# Patient Record
Sex: Male | Born: 1935 | Race: Black or African American | Hispanic: No | Marital: Single
Health system: Southern US, Community
[De-identification: ages and names within clinical notes are randomized; demographics above are authoritative.]

---

## 2012-05-19 ENCOUNTER — Encounter: Payer: Self-pay | Admitting: Rehabilitation

## 2012-06-09 ENCOUNTER — Encounter: Payer: Self-pay | Admitting: Rehabilitation

## 2013-05-23 ENCOUNTER — Inpatient Hospital Stay: Payer: Self-pay | Admitting: Internal Medicine

## 2013-05-23 LAB — BASIC METABOLIC PANEL
Anion Gap: 3 — ABNORMAL LOW (ref 7–16)
BUN: 47 mg/dL — ABNORMAL HIGH (ref 7–18)
Calcium, Total: 9.8 mg/dL (ref 8.5–10.1)
Chloride: 116 mmol/L — ABNORMAL HIGH (ref 98–107)
Co2: 30 mmol/L (ref 21–32)
EGFR (Non-African Amer.): 31 — ABNORMAL LOW
Potassium: 4.3 mmol/L (ref 3.5–5.1)

## 2013-05-23 LAB — CBC WITH DIFFERENTIAL/PLATELET
Basophil %: 0.6 %
Eosinophil #: 0.1 10*3/uL (ref 0.0–0.7)
Eosinophil %: 0.9 %
Lymphocyte #: 1.2 10*3/uL (ref 1.0–3.6)
Lymphocyte %: 16.4 %
MCV: 82 fL (ref 80–100)
Monocyte #: 0.8 x10 3/mm (ref 0.2–1.0)
Neutrophil #: 5.1 10*3/uL (ref 1.4–6.5)
RBC: 6.92 10*6/uL — ABNORMAL HIGH (ref 4.40–5.90)
RDW: 15.3 % — ABNORMAL HIGH (ref 11.5–14.5)
WBC: 7.2 10*3/uL (ref 3.8–10.6)

## 2013-05-23 LAB — URINALYSIS, COMPLETE
Blood: NEGATIVE
Glucose,UR: NEGATIVE mg/dL (ref 0–75)
Ketone: NEGATIVE
Leukocyte Esterase: NEGATIVE
Nitrite: NEGATIVE
Specific Gravity: 1.013 (ref 1.003–1.030)
Squamous Epithelial: NONE SEEN

## 2013-05-23 LAB — COMPREHENSIVE METABOLIC PANEL
Anion Gap: 5 — ABNORMAL LOW (ref 7–16)
BUN: 58 mg/dL — ABNORMAL HIGH (ref 7–18)
Co2: 28 mmol/L (ref 21–32)
Creatinine: 2.52 mg/dL — ABNORMAL HIGH (ref 0.60–1.30)
EGFR (African American): 28 — ABNORMAL LOW
Glucose: 108 mg/dL — ABNORMAL HIGH (ref 65–99)
Potassium: 4.2 mmol/L (ref 3.5–5.1)
SGOT(AST): 35 U/L (ref 15–37)
SGPT (ALT): 37 U/L (ref 12–78)
Sodium: 149 mmol/L — ABNORMAL HIGH (ref 136–145)
Total Protein: 9.2 g/dL — ABNORMAL HIGH (ref 6.4–8.2)

## 2013-05-23 LAB — APTT: Activated PTT: 148.7 secs — ABNORMAL HIGH (ref 23.6–35.9)

## 2013-05-23 LAB — CK TOTAL AND CKMB (NOT AT ARMC)
CK, Total: 136 U/L (ref 35–232)
CK, Total: 104 U/L (ref 35–232)
CK-MB: 2 ng/mL (ref 0.5–3.6)

## 2013-05-23 LAB — CBC
HCT: 57.9 % — ABNORMAL HIGH (ref 40.0–52.0)
HGB: 18 g/dL (ref 13.0–18.0)
MCHC: 31.1 g/dL — ABNORMAL LOW (ref 32.0–36.0)
MCV: 82 fL (ref 80–100)
Platelet: 166 10*3/uL (ref 150–440)
RBC: 7.03 10*6/uL — ABNORMAL HIGH (ref 4.40–5.90)
RDW: 14.9 % — ABNORMAL HIGH (ref 11.5–14.5)
WBC: 6.4 10*3/uL (ref 3.8–10.6)

## 2013-05-23 LAB — TROPONIN I
Troponin-I: 0.15 ng/mL — ABNORMAL HIGH
Troponin-I: 0.14 ng/mL — ABNORMAL HIGH
Troponin-I: 0.17 ng/mL — ABNORMAL HIGH

## 2013-05-23 LAB — AMMONIA: Ammonia, Plasma: 26 mcmol/L (ref 11–32)

## 2013-05-23 LAB — PROTIME-INR: INR: 1.2

## 2013-05-24 ENCOUNTER — Ambulatory Visit: Payer: Self-pay | Admitting: Internal Medicine

## 2013-05-24 LAB — BASIC METABOLIC PANEL
Anion Gap: 4 — ABNORMAL LOW (ref 7–16)
Anion Gap: 2 — ABNORMAL LOW (ref 7–16)
Chloride: 117 mmol/L — ABNORMAL HIGH (ref 98–107)
Co2: 28 mmol/L (ref 21–32)
Creatinine: 1.75 mg/dL — ABNORMAL HIGH (ref 0.60–1.30)
Creatinine: 1.68 mg/dL — ABNORMAL HIGH (ref 0.60–1.30)
EGFR (African American): 43 — ABNORMAL LOW
EGFR (African American): 45 — ABNORMAL LOW
EGFR (Non-African Amer.): 37 — ABNORMAL LOW
EGFR (Non-African Amer.): 39 — ABNORMAL LOW
Osmolality: 305 (ref 275–301)
Osmolality: 302 (ref 275–301)
Potassium: 4 mmol/L (ref 3.5–5.1)
Potassium: 4.3 mmol/L (ref 3.5–5.1)
Sodium: 148 mmol/L — ABNORMAL HIGH (ref 136–145)

## 2013-05-24 LAB — CBC WITH DIFFERENTIAL/PLATELET
Basophil #: 0 10*3/uL (ref 0.0–0.1)
Basophil %: 0.8 %
Eosinophil %: 1.4 %
HGB: 16.2 g/dL (ref 13.0–18.0)
MCHC: 32.2 g/dL (ref 32.0–36.0)
MCV: 82 fL (ref 80–100)
Monocyte %: 13.3 %
Neutrophil #: 3.2 10*3/uL (ref 1.4–6.5)
Neutrophil %: 59 %
Platelet: 157 10*3/uL (ref 150–440)
RBC: 6.16 10*6/uL — ABNORMAL HIGH (ref 4.40–5.90)
RDW: 15 % — ABNORMAL HIGH (ref 11.5–14.5)
WBC: 5.4 10*3/uL (ref 3.8–10.6)

## 2013-05-24 LAB — LIPID PANEL
Cholesterol: 132 mg/dL (ref 0–200)
Triglycerides: 83 mg/dL (ref 0–200)
VLDL Cholesterol, Calc: 17 mg/dL (ref 5–40)

## 2013-05-24 LAB — PHOSPHORUS: Phosphorus: 2.7 mg/dL (ref 2.5–4.9)

## 2013-05-24 LAB — MAGNESIUM: Magnesium: 2.8 mg/dL — ABNORMAL HIGH

## 2013-05-24 LAB — PROTEIN / CREATININE RATIO, URINE
Creatinine, Urine: 161.2 mg/dL — ABNORMAL HIGH (ref 30.0–125.0)
Protein, Random Urine: 77 mg/dL — ABNORMAL HIGH (ref 0–12)

## 2013-05-25 LAB — BASIC METABOLIC PANEL
BUN: 27 mg/dL — ABNORMAL HIGH (ref 7–18)
Calcium, Total: 9.6 mg/dL (ref 8.5–10.1)
Co2: 28 mmol/L (ref 21–32)
Creatinine: 1.66 mg/dL — ABNORMAL HIGH (ref 0.60–1.30)
EGFR (African American): 46 — ABNORMAL LOW
EGFR (Non-African Amer.): 40 — ABNORMAL LOW
Osmolality: 294 (ref 275–301)
Potassium: 3.9 mmol/L (ref 3.5–5.1)
Sodium: 144 mmol/L (ref 136–145)

## 2013-05-26 LAB — BASIC METABOLIC PANEL
Anion Gap: 4 — ABNORMAL LOW (ref 7–16)
BUN: 21 mg/dL — ABNORMAL HIGH (ref 7–18)
Calcium, Total: 9.3 mg/dL (ref 8.5–10.1)
Chloride: 112 mmol/L — ABNORMAL HIGH (ref 98–107)
Co2: 26 mmol/L (ref 21–32)
Creatinine: 1.5 mg/dL — ABNORMAL HIGH (ref 0.60–1.30)
EGFR (African American): 52 — ABNORMAL LOW
Glucose: 134 mg/dL — ABNORMAL HIGH (ref 65–99)
Osmolality: 288 (ref 275–301)

## 2013-05-26 LAB — KAPPA/LAMBDA FREE LIGHT CHAINS (ARMC)

## 2013-05-26 LAB — PROTEIN ELECTROPHORESIS(ARMC)

## 2013-06-03 ENCOUNTER — Inpatient Hospital Stay: Payer: Self-pay | Admitting: Internal Medicine

## 2013-06-03 LAB — CBC
HCT: 61.3 % — ABNORMAL HIGH (ref 40.0–52.0)
MCV: 84 fL (ref 80–100)
Platelet: 114 10*3/uL — ABNORMAL LOW (ref 150–440)
RDW: 15.3 % — ABNORMAL HIGH (ref 11.5–14.5)
WBC: 9.2 10*3/uL (ref 3.8–10.6)

## 2013-06-03 LAB — COMPREHENSIVE METABOLIC PANEL
Albumin: 3.8 g/dL (ref 3.4–5.0)
Bilirubin,Total: 0.7 mg/dL (ref 0.2–1.0)
EGFR (African American): 18 — ABNORMAL LOW
EGFR (Non-African Amer.): 16 — ABNORMAL LOW
Glucose: 146 mg/dL — ABNORMAL HIGH (ref 65–99)
Potassium: 5.6 mmol/L — ABNORMAL HIGH (ref 3.5–5.1)
SGOT(AST): 51 U/L — ABNORMAL HIGH (ref 15–37)
SGPT (ALT): 39 U/L (ref 12–78)
Total Protein: 8.9 g/dL — ABNORMAL HIGH (ref 6.4–8.2)

## 2013-06-03 LAB — TROPONIN I: Troponin-I: 0.28 ng/mL — ABNORMAL HIGH

## 2013-06-04 LAB — BASIC METABOLIC PANEL
BUN: 107 mg/dL — ABNORMAL HIGH (ref 7–18)
BUN: 100 mg/dL — ABNORMAL HIGH (ref 7–18)
BUN: 94 mg/dL — ABNORMAL HIGH (ref 7–18)
BUN: 86 mg/dL — ABNORMAL HIGH (ref 7–18)
Calcium, Total: 9.8 mg/dL (ref 8.5–10.1)
Calcium, Total: 9.9 mg/dL (ref 8.5–10.1)
Calcium, Total: 9.4 mg/dL (ref 8.5–10.1)
Chloride: >128 mmol/L — ABNORMAL HIGH (ref 98–107)
Chloride: 128 mmol/L — ABNORMAL HIGH (ref 98–107)
Chloride: >128 mmol/L — ABNORMAL HIGH (ref 98–107)
Chloride: 125 mmol/L — ABNORMAL HIGH (ref 98–107)
Co2: 31 mmol/L (ref 21–32)
Co2: 27 mmol/L (ref 21–32)
Co2: 25 mmol/L (ref 21–32)
Creatinine: 3.58 mg/dL — ABNORMAL HIGH (ref 0.60–1.30)
Creatinine: 2.92 mg/dL — ABNORMAL HIGH (ref 0.60–1.30)
EGFR (African American): 18 — ABNORMAL LOW
EGFR (African American): 20 — ABNORMAL LOW
EGFR (African American): 22 — ABNORMAL LOW
EGFR (African American): 23 — ABNORMAL LOW
EGFR (Non-African Amer.): 16 — ABNORMAL LOW
EGFR (Non-African Amer.): 17 — ABNORMAL LOW
Glucose: 178 mg/dL — ABNORMAL HIGH (ref 65–99)
Glucose: 178 mg/dL — ABNORMAL HIGH (ref 65–99)
Potassium: 4.7 mmol/L (ref 3.5–5.1)
Sodium: >160 mmol/L (ref 136–145)
Sodium: >160 mmol/L (ref 136–145)
Sodium: 157 mmol/L — ABNORMAL HIGH (ref 136–145)

## 2013-06-04 LAB — URINALYSIS, COMPLETE
Bacteria: NONE SEEN
Bilirubin,UR: NEGATIVE
Glucose,UR: NEGATIVE mg/dL (ref 0–75)
Hyaline Cast: 3
Ketone: NEGATIVE
Leukocyte Esterase: NEGATIVE
Ph: 5 (ref 4.5–8.0)
Protein: 30
RBC,UR: 3 /HPF (ref 0–5)
Squamous Epithelial: NONE SEEN
WBC UR: 2 /HPF (ref 0–5)

## 2013-06-04 LAB — CK-MB
CK-MB: 3 ng/mL (ref 0.5–3.6)
CK-MB: 3.6 ng/mL (ref 0.5–3.6)

## 2013-06-04 LAB — TROPONIN I
Troponin-I: 0.28 ng/mL — ABNORMAL HIGH
Troponin-I: 0.28 ng/mL — ABNORMAL HIGH

## 2013-06-04 LAB — PHOSPHORUS: Phosphorus: 5.1 mg/dL — ABNORMAL HIGH (ref 2.5–4.9)

## 2013-06-04 LAB — MAGNESIUM: Magnesium: 3.9 mg/dL — ABNORMAL HIGH

## 2013-06-05 LAB — BASIC METABOLIC PANEL
Anion Gap: 9 (ref 7–16)
Anion Gap: 9 (ref 7–16)
BUN: 75 mg/dL — ABNORMAL HIGH (ref 7–18)
Calcium, Total: 9.3 mg/dL (ref 8.5–10.1)
Chloride: 124 mmol/L — ABNORMAL HIGH (ref 98–107)
Co2: 19 mmol/L — ABNORMAL LOW (ref 21–32)
Co2: 20 mmol/L — ABNORMAL LOW (ref 21–32)
Creatinine: 2.63 mg/dL — ABNORMAL HIGH (ref 0.60–1.30)
EGFR (African American): 25 — ABNORMAL LOW
EGFR (African American): 28 — ABNORMAL LOW
EGFR (Non-African Amer.): 22 — ABNORMAL LOW
EGFR (Non-African Amer.): 24 — ABNORMAL LOW
Glucose: 206 mg/dL — ABNORMAL HIGH (ref 65–99)
Glucose: 147 mg/dL — ABNORMAL HIGH (ref 65–99)
Osmolality: 333 (ref 275–301)
Potassium: 4.3 mmol/L (ref 3.5–5.1)
Potassium: 4.3 mmol/L (ref 3.5–5.1)
Potassium: 4.3 mmol/L (ref 3.5–5.1)
Sodium: 155 mmol/L — ABNORMAL HIGH (ref 136–145)

## 2013-06-06 LAB — BASIC METABOLIC PANEL
Anion Gap: 6 — ABNORMAL LOW (ref 7–16)
BUN: 52 mg/dL — ABNORMAL HIGH (ref 7–18)
Calcium, Total: 9.2 mg/dL (ref 8.5–10.1)
Chloride: 123 mmol/L — ABNORMAL HIGH (ref 98–107)
Co2: 24 mmol/L (ref 21–32)
Creatinine: 2.36 mg/dL — ABNORMAL HIGH (ref 0.60–1.30)
Osmolality: 320 (ref 275–301)
Potassium: 3.7 mmol/L (ref 3.5–5.1)
Sodium: 153 mmol/L — ABNORMAL HIGH (ref 136–145)

## 2013-06-07 LAB — BASIC METABOLIC PANEL
Anion Gap: 6 — ABNORMAL LOW (ref 7–16)
BUN: 36 mg/dL — ABNORMAL HIGH (ref 7–18)
Calcium, Total: 9.1 mg/dL (ref 8.5–10.1)
Chloride: 119 mmol/L — ABNORMAL HIGH (ref 98–107)
Creatinine: 2.09 mg/dL — ABNORMAL HIGH (ref 0.60–1.30)
EGFR (Non-African Amer.): 30 — ABNORMAL LOW
Sodium: 152 mmol/L — ABNORMAL HIGH (ref 136–145)

## 2013-06-09 ENCOUNTER — Ambulatory Visit: Payer: Self-pay | Admitting: Internal Medicine

## 2013-07-10 DEATH — deceased

## 2014-08-28 IMAGING — CT CT HEAD WITHOUT CONTRAST
2 of 3 series · 16 of 30 positions shown, 19 images · non-contrast
Comparison: None.

CLINICAL DATA: Periods of apnea.  Progressive confusion.

EXAM:
CT HEAD WITHOUT CONTRAST
TECHNIQUE: Contiguous axial images were obtained from the base of the skull
through the vertex without intravenous contrast.

[Series 2: head wo · axial · 0.46mm/px · z∈[+370,+490]mm · 9 of 31 slices shown, 12 images]
[im 4/31  brain]
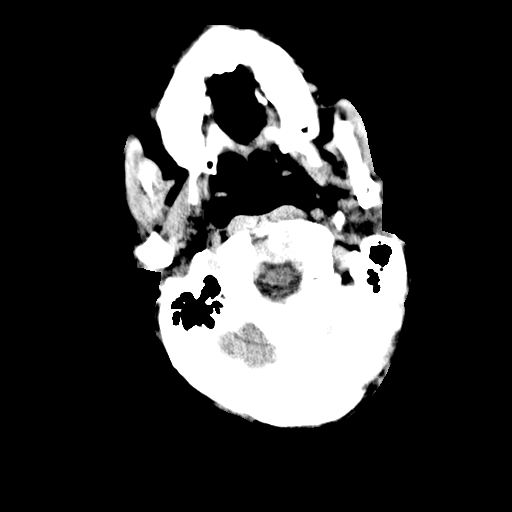
[im 4/31  bone]
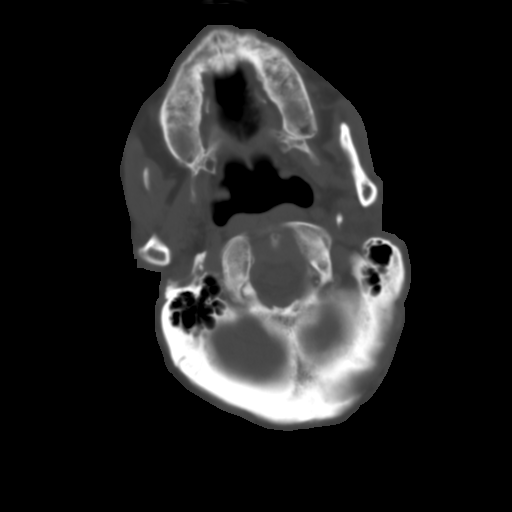
[im 7/31  brain]
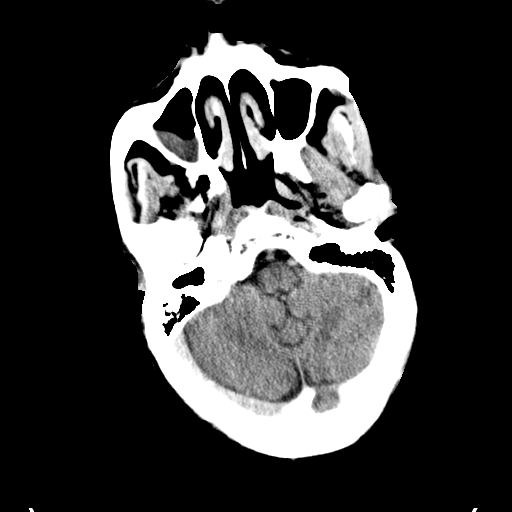
[im 10/31  brain]
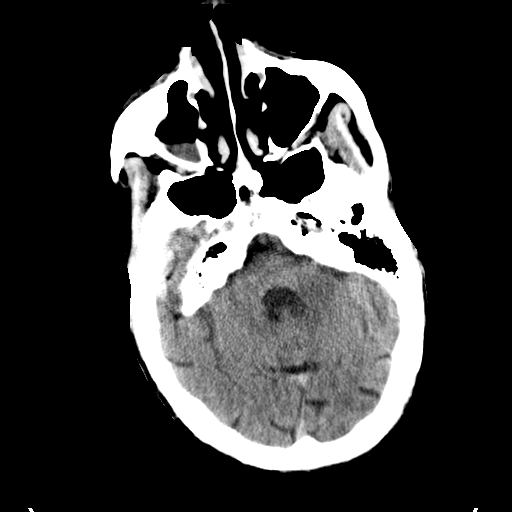
[im 13/31  brain]
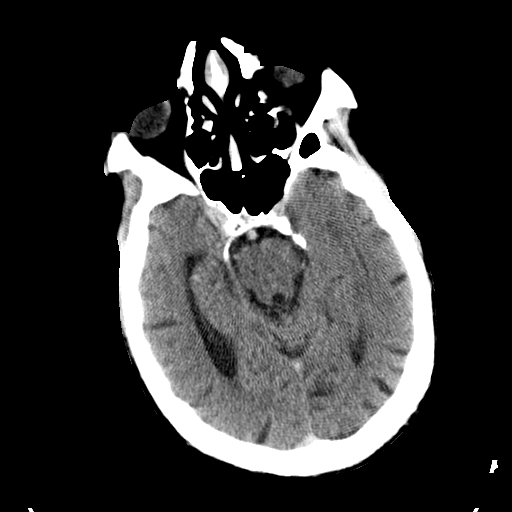
[im 16/31  brain]
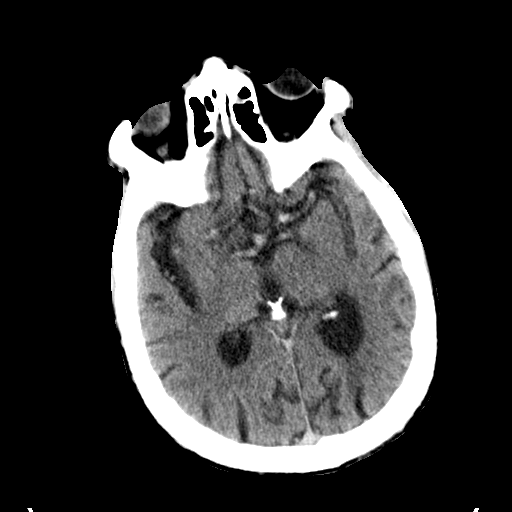
[im 16/31  bone]
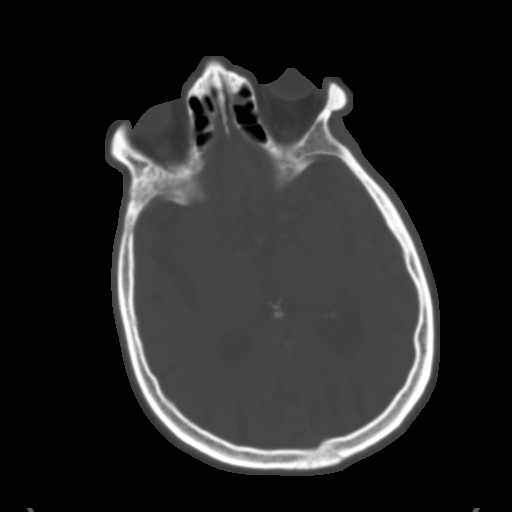
[im 19/31  brain]
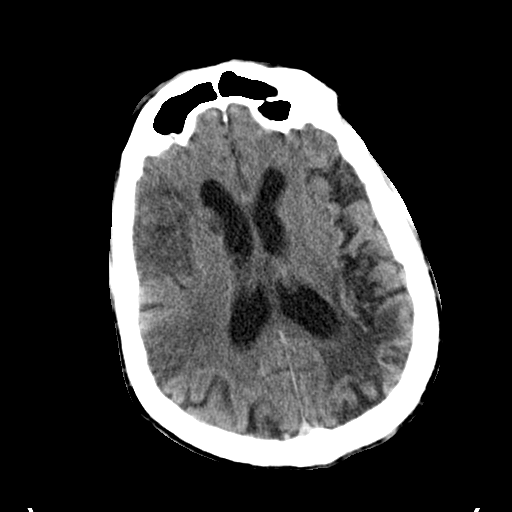
[im 22/31  brain]
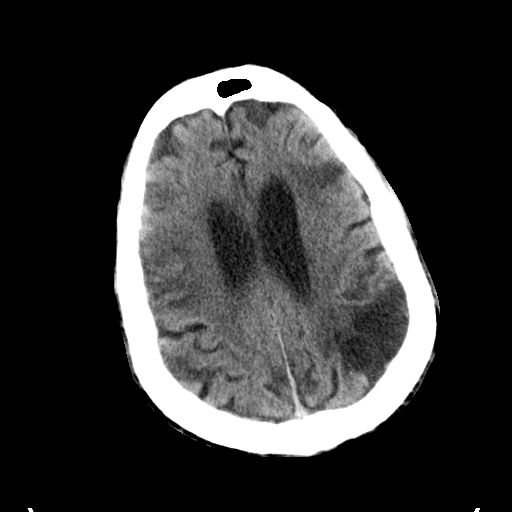
[im 25/31  brain]
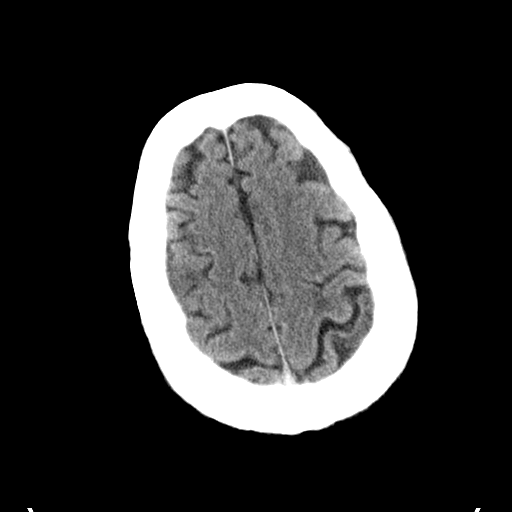
[im 28/31  brain]
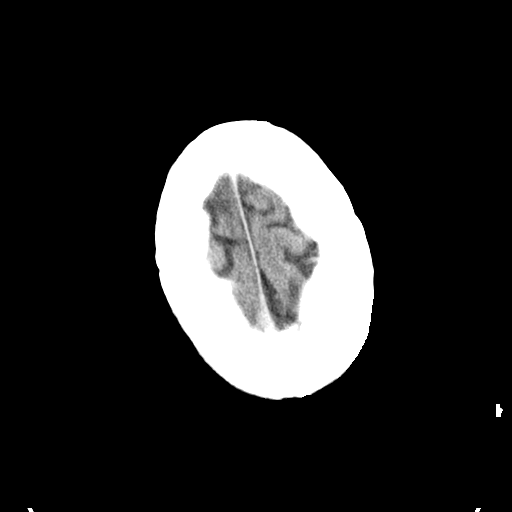
[im 28/31  bone]
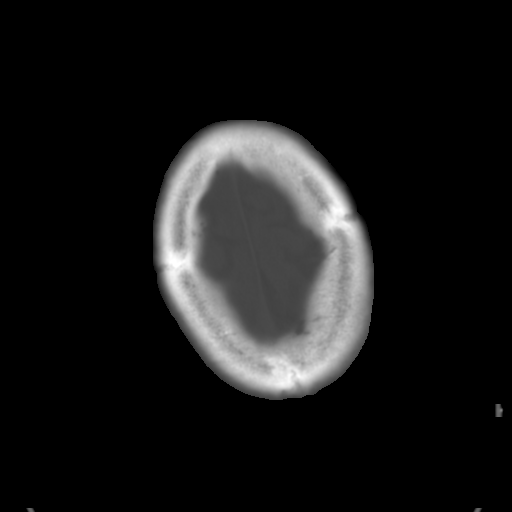

[Series 4: head wo recon · axial · 0.46mm/px · z∈[+449,+527]mm · 7 of 26 slices shown]
[im 4/26  brain]
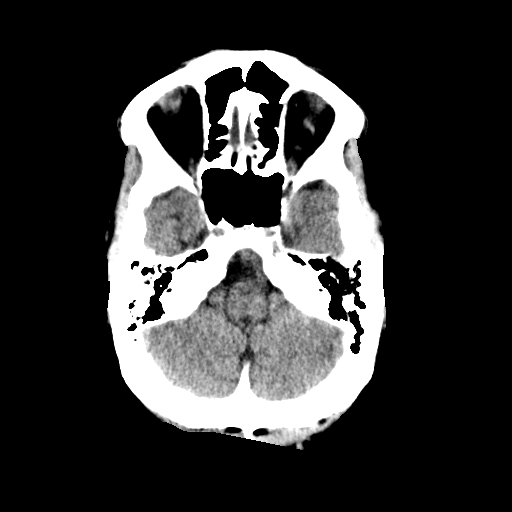
[im 7/26  brain]
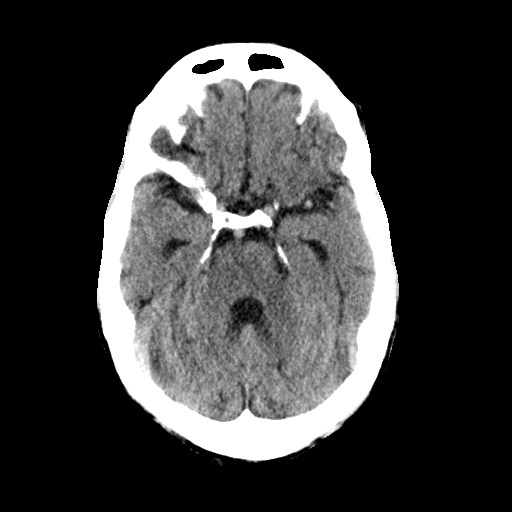
[im 10/26  brain]
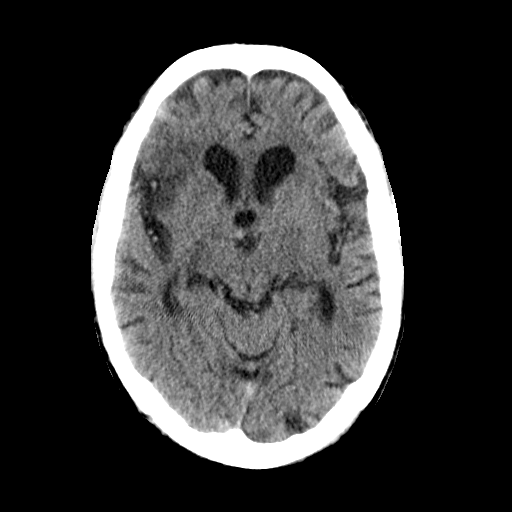
[im 13/26  brain]
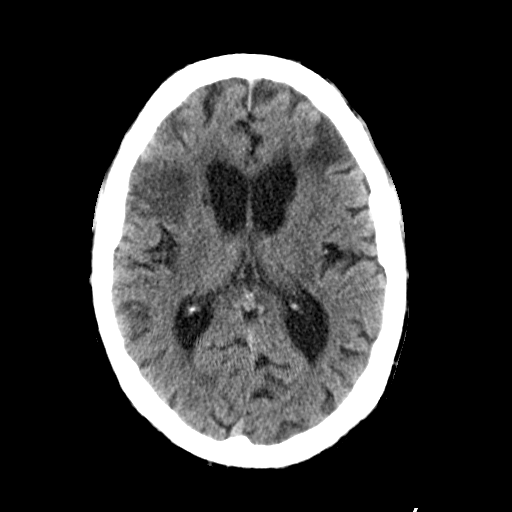
[im 16/26  brain]
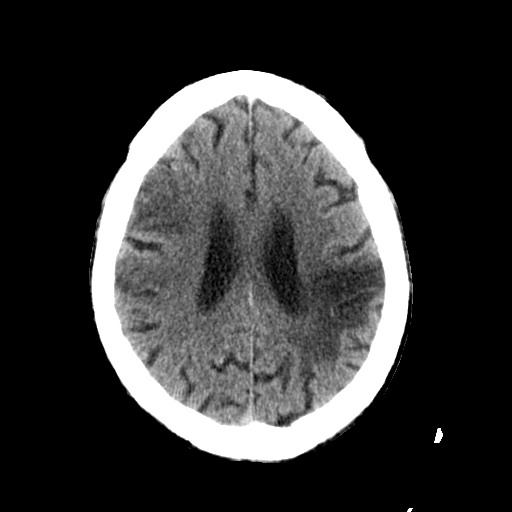
[im 19/26  brain]
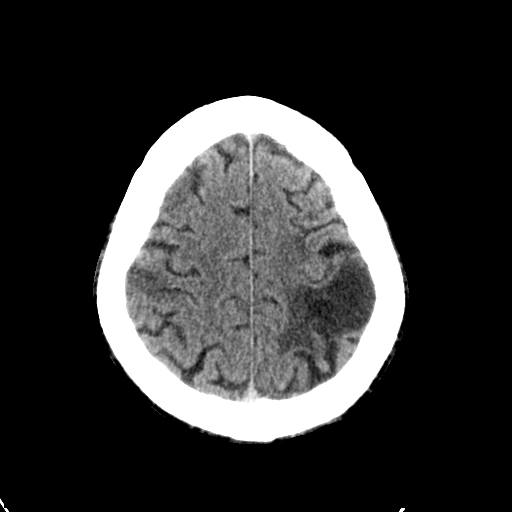
[im 22/26  brain]
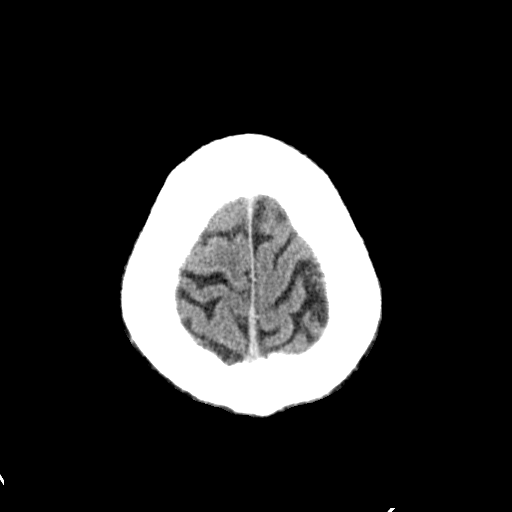

[16 of 30 positions shown; findings below may reference images not displayed]

FINDINGS: There are progressive changes of large right MCA infarct. Compared
with the previous examination there is been increase in low
attenuation involving the right frontal and parietal lobes with
further effacement of the sulci and loss of gray white matter
differentiation. Stable appearance of encephalomalacia involving the
left parietal and frontal lobes. The stress set there is prominence
of the sulci and ventricles consistent with atrophy. No evidence for
acute intracranial hemorrhage or mass. No midline shift identified.
The paranasal sinuses appear clear. The mastoid air cells are clear.
The skull is intact.
IMPRESSION: 1. Progressive changes of right MCA territory infarct.
2. Stable encephalomalacia involving the left frontal lobe and left
parietal lobe.

## 2014-09-29 NOTE — Consult Note (Signed)
Brief Consult Note: Diagnosis: alteerred mental status. not able to give history.   Patient was seen by consultant.   Recommend further assessment or treatment.   Comments: 79 yo male with apparent history of diastollic chf in the past. Admitted with alterred mental status. Minimzl troponin elevationi likely secndary to demand ischemia and renal insuffiency. Echo pending. CXR does not suggest florid edema. ill follow and review echo when available. Fuill note to follow.  Electronic Signatures: Dalia HeadingFath, Lielle Vandervort A (MD)  (Signed 15-Dec-14 17:01)  Authored: Brief Consult Note   Last Updated: 15-Dec-14 17:01 by Dalia HeadingFath, Devyn Sheerin A (MD)

## 2014-09-29 NOTE — H&P (Signed)
PATIENT NAME:  Jacob Petty, Jacob Petty MR#:  [id number] DATE OF BIRTH:  Sep 17, 1935  DATE OF ADMISSION:  05/23/2013  PRIMARY CARE PHYSICIAN: Nonlocal.    REFERRING PHYSICIAN: Dr. Suella Broad.   CHIEF COMPLAINT: Altered mental status.   HISTORY OF PRESENT ILLNESS: The patient is a 79 year old African American male residing at [facility] with a past medical history of stroke with bilateral hemiparesis, hypertension, chronic systolic and diastolic congestive heart failure, hypercholesterolemia, he has been brought into the ER via EMS from [facility] for altered mental status. The patient was found to be more confused and lethargic at [facility] and the nurse has called EMS to transfer the patient for further evaluation. In the ER the patient initially talked in sentences to the ER nurse, but during my examination he is totally lethargic and  opening his eyes to his name, but falling asleep. No family members are available at bedside and I was unable to reach them. The patient's initial CAT scan of the head has revealed moderate global parenchymal brain volume loss and remote left middle cerebral artery territory infarct. The patient's troponin is elevated at 0.15. A 12-lead EKG has revealed sinus rhythm at 77 beats per minute with left axis deviation. No acute ST-T wave changes. Hospitalist team was called to admit the patient. The patient is started on heparin drip as troponin is positive.   PAST MEDICAL HISTORY: From [facility] medical records, history of stroke with hemiparesis, hypertension, hyperlipidemia, chronic systolic and diastolic congestive heart failure and probably renal insufficiency.   PAST SURGICAL HISTORY: Unavailable.   ALLERGIES: No known drug allergies.   PSYCHOSOCIAL HISTORY: He resides at [facility]. History of smoking, alcohol is not available.   FAMILY HISTORY: Unavailable.  REVIEW OF SYSTEMS: Unobtainable as the patient is lethargic.   HOME  MEDICATIONS: Senna 8.6 mg 2 tablets p.o. once daily, lisinopril 10 mg once a day, Colace 100 mg 3 times a day as needed,  aspirin 81 mg once daily.   PHYSICAL EXAMINATION:  VITAL SIGNS: Temperature 97 degrees Fahrenheit, pulse 68, respirations 14 to 18, blood pressure 110/71, pulse oximetry 94%.  GENERAL APPEARANCE: Not in respiratory distress. The patient is very lethargic but arousable to his name and touch sensation and falling asleep. No family members are at bedside.  HEENT: Normocephalic, atraumatic. Pupils are equally reacting to light and accommodation. They are sluggishly reacting 2 to 3 mm in size. No scleral icterus. No sinus tenderness. Dry mucous membranes.  NECK: Supple. No JVD. No thyromegaly. Range of motion is intact.  LUNGS: Clear to auscultation bilaterally. No accessory muscle usage. No anterior chest tenderness on palpation.  CARDIAC: S1, S2 normal. Regular rate and rhythm. No murmurs.  GASTROINTESTINAL: Soft. Bowel sounds are positive in all four quadrants. No masses felt. No hepatosplenomegaly.  NEUROLOGIC: Arousable to verbal commands, but not answering any questions, very lethargic. Could not follow verbal commands, could not elicit motor and sensory. Reflexes are 2+.  EXTREMITIES: No edema. No cyanosis. No clubbing.  SKIN: Warm to touch. Dry in nature.  MUSCULOSKELETAL: No joint effusion, tenderness.  LABORATORY AND IMAGING STUDIES: WBC 6.4, hemoglobin 18.0, hematocrit 57.9, platelets 166. Urinalysis yellow in color, clear in appearance. Nitrite and leukesterase  are negative  . LFTs: Total protein 9.2, alkaline phosphatase 148, troponin 0.15, AST and ALT are normal. BMP: BUN is at 58, creatinine 2.52, we do not know his baseline. Sodium 149, potassium 4.2, chloride 116, CO2 28, GFR is 28. Anion gap 5, calcium 10.5,  serum osmolality 313. Glucose is 108. CAT scan of the head without contrast has revealed remote left middle cerebral artery territory infarct. Mild blurring,  which may reflect motion artifact. A remote right basal ganglia and lacunar infarcts, moderate global parenchymal brain volume loss, appearance of the intracranial vessels suggest chronic hypertension. Chest x-ray, portable; mild cardiomegaly. No acute pulmonary process. A 12-lead EKG: Normal sinus rhythm at 77 beats per minute with PVCs. Normal PR interval. No acute ST-T wave changes. Inferior infarct Q waves.  ASSESSMENT AND PLAN: A 79 year old African American male brought into the Emergency Room with altered mental status from [facility] will be admitted with following assessment and plan.  1. Altered mental status. Rule out acute myocardial infarction, rule out stroke. The patient will be admitted to Critical Care Unit. We will cycle cardiac biomarkers. The patient is placed on heparin bolus and drip. Cardiology consult is placed to  Dr. Darrold JunkerParaschos, who is on call. We will order MRA of the brain. We will get a neurology consult as well.  2. Acute kidney injury. Baseline is unknown. We will provide him IV fluids while he is n.p.o.  and monitor renal function closely. Avoid nephrotoxins.  3. Remote history of stroke. The patient will be on baby aspirin and he is on heparin drip.  4. History of congestive heart failure both systolic and diastolic. The patient is not fluid overloaded. The patient being n.p.o. and lethargic, we will provide him with gently hydration with IV fluids and monitor for symptoms and signs of fluid overload.  5. Hypertension. Blood pressure is stable at this time. We will hold off on the blood pressure medication.  6. Hyperlipidemia.  7. I could not reach any family members. They are not available bedside, phone numbers are not available. The patient is FULL CODE until CODE STATUS is determined. We will give him gastrointestinal and deep vein thrombosis prophylaxis.   TOTAL TIME SPENT ON ADMISSION: Is 50 minutes.   Condition is critical.     ____________________________ Ramonita LabAruna Anber Mckiver, MD ag:sg D: 05/23/2013 07:01:49 ET T: 05/23/2013 07:31:59 ET JOB#: [id number]  cc: Ramonita LabAruna Aiden Rao, MD, <Dictator> Ramonita LabARUNA Dima Ferrufino MD ELECTRONICALLY SIGNED 06/05/2013 0:34

## 2014-09-29 NOTE — Discharge Summary (Signed)
PATIENT NAME:  Jacob Petty, Jacob Petty MR#:  [id number] DATE OF BIRTH:  [birth year]  DATE OF ADMISSION:  05/23/2013 DATE OF DISCHARGE:  05/26/2013   ADMISSION DIAGNOSIS:  Altered mental status.  DISCHARGE DIAGNOSES:   1.  Altered mental status secondary to progression of previous stroke.  2.  Cardiomyopathy, ejection fraction of 20% to 25%.  3.  Acute on chronic renal failure from acute tubular necrosis.  4.  History of chronic diastolic and systolic heart failure.  5.  Hypertension.  6.  Hypercalcemia from dehydration.  7.  Elevated troponin.  8.  Dysphasia from cerebrovascular accident.   CONSULTATIONS:  1.  Dr. Mosetta PigeonHarmeet Singh.  2.  Dr. Harvie JuniorPhifer.  3.  Dr. Theora MasterZachary Potter from neurology.  4.  Dr. Lady GaryFath from cardiology.  A 2-D echocardiogram showed an EF of 20% to 25% with severely decreased global left ventricular systolic function, mild to moderate increased left ventricular internal cavity size.   CT of the head, 05/25/2013, progression of acute infarct in the right insula and right frontal lobe without hemorrhage. EEG showed no seizure-like activity.   Discharge sodium 142, potassium 3.7, chloride 112, bicarb 26, BUN 21, creatinine 1.50. Glucose is 134. LDL 56, VLDL 17, HDL 59, triglycerides 83, cholesterol 132. Troponin max 0.17.   HOSPITAL COURSE: This is a 79 year old male from [facility] with a history of stroke and bilateral hemiparesis. He was brought to the ER for altered mental status. For further details, please refer to the H and P.  1.  Altered mental status secondary to an acute cerebrovascular accident. The patient had a CT scan repeated on the 17th, which showed progression of acute infarct in the right insula and right frontal lobe without hemorrhage. He cannot obtain an MRI, so the CT was done. He continues to have aphasia, left-sided weakness. He was seen by rehab, and he will go back to the rehabilitation center with PT, OT and speech. He is currently on dysphagia 1  diet.  2.  Acute on chronic renal failure from acute tubular necrosis. Appreciate nephrology consult. His creatinine has improved.  3.  History of chronic diastolic heart failure. He also has systolic heart failure. He was euvolemic. No obvious exacerbation at this time.  4.  Hypertension. The patient is now on low-dose Coreg due to nonsustained Vtach, which was seen here while the patient was hospitalized.  5.  Hypercalcemia secondary to dehydration, improved with intravenous fluids.  6.  Elevated troponin: The patient had an echocardiogram, showed an EF of 20% to 25%. Cardiology was consulted.  The troponin is secondary to his acute cerebral ischemic event and not due to acute coronary syndrome.  7.  Dysphagia from his cerebrovascular accident. Speech has recommended a dysphagia 1 diet with precautions for aspiration.  8.  Nonsustained ventricular tach. The patient had some evidence of nonsustained Vtach. He had an EF of 20% to 25% by echocardiogram. He also had some bradycardia. He was initially on amiodarone, but Dr. Lady GaryFath did not feel the patient needed amiodarone, due to the persistent bradycardia with intermittent junctional rhythm. He recommended discontinuing amiodarone. He was started on a very low-dose beta blocker, as heart rate did tolerate this very low-dose beta blocker,   DISCHARGE MEDICATIONS: 1.  Atorvastatin 80 mg at bedtime.  2.  Senna 8.6, 2 tablets daily.  3.  Aspirin 325 mg daily.  4.  Coreg 3.125 daily.   DISCHARGE DIET: Low-fat, low-cholesterol Magic Cup twice a day, pureed honey-thick liquids. Start strict aspiration  precautions, liquids by teaspoon feeding assistance at all meals, meds crushed in puree. Monitor for swallowing and oral cleaning when feeding. The patient required verbal cues at times to swallow foods and liquids. Moisten all foods.   REFERRALS:  PT, OT, speech.   DISCHARGE FOLLOWUP:  The  patient will follow up with Dr. Lady Gary in 1 to 2 weeks. Due to his  cardiomyopathy, he will need a repeat echocardiogram, and perhaps may need an AICD in the future.   The patient is medically stable for discharge.   TIME SPENT: Approximately 40 minutes.   ____________________________ Janyth Contes. Juliene Pina, MD spm:dmm D: 05/26/2013 12:40:55 ET T: 05/26/2013 12:58:34 ET JOB#: [id number]  cc: Makyle Eslick P. Juliene Pina, MD, <Dictator> Darlin Priestly. Lady Gary, MD Janyth Contes Orest Dygert MD ELECTRONICALLY SIGNED 05/26/2013 15:52

## 2014-09-29 NOTE — Consult Note (Signed)
   Comments   I met with pt's daughter and 2 granddaughters. Updated them on pt's current medical condition. They understand that pt was likely not taking in adequate po and became dehydrated. We discussed PEG for feeding. Family does not want to pursue PEG. They would like for pt to return to [facility] at discharge. I suggested that palliative care NP follow pt at [facility] as well. If pt declines again, then family would opt for care at the [facility].  discussed code status with family. They do not feel that pt understood the discussion I previously had with him when he indicated that he wanted to be a full code. They want pt to be DNR. Order entered. Out-of-facility DNR completed and placed in chart.   Electronic Signatures: Tennile Styles, Izora Gala (MD)  (Signed 29-Dec-14 13:00)  Authored: Palliative Care   Last Updated: 29-Dec-14 13:00 by Kailani Brass, Izora Gala (MD)

## 2014-09-29 NOTE — Consult Note (Signed)
   Comments   I met with pt in the presence of his daughter, granddaughter, aunt and ex-wife. Pt is awake, nods head appropriately to questions. Family is hopeful that pt can return to [facility] to continue STR. They understand that he may not return to independent living in his own home again and may need a long-term bed in SNF. We discussed PEG in the event pt's po intake is not adequate but pt and family do not want this.  discussed code status at length. After explaining the difference in full code and DNR, pt repeatedly states that he wants to remain a full code. Family respects pt's wishes.   Electronic Signatures: Calah Gershman, Izora Gala (MD)  (Signed 16-Dec-14 19:54)  Authored: Palliative Care   Last Updated: 16-Dec-14 19:54 by Alanny Rivers, Izora Gala (MD)
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
## 2014-09-30 NOTE — Discharge Summary (Signed)
PATIENT NAME:  Jacob Petty, Jacob Petty MR#:  [id number] DATE OF BIRTH:  [birth year]  DATE OF ADMISSION:  06/03/2013 DATE OF DISCHARGE:  06/08/2013  PRIMARY CARE PHYSICIAN:  Alden ServerErnest B. Maryellen PileEason, MD  FINAL DIAGNOSES: 1.  Acute encephalopathy.  2.  Severe hypernatremia.  3.  Acute renal failure.  4.  Failure to thrive.  5.  History of cerebrovascular accident with aphasia and dysphasia, left-sided weakness.  6.  History of hypertension.  7.  Hyperlipidemia.   DISPOSITION:  The patient discharged to the hospice home with Foley as needed. The patient is a DO NOT RESUSCITATE.   DISCHARGE MEDICATIONS:  Aspirin 325 mg daily, morphine 20 mg/5 mL, 5 mL every 2 hours as needed for pain or shortness of breath.   DIET: As tolerated.   ACTIVITY: As tolerated.   HISTORY OF PRESENT ILLNESS:  The patient was brought in with altered mental status, found with acute renal failure, severe hypernatremia and hyperkalemia and altered mental status.   LABORATORY, DIAGNOSTIC AND RADIOLOGICAL DATA DURING THE HOSPITAL COURSE: Included: A magnesium of 3.9, phosphorus 2.1. EKG normal sinus rhythm, supraventricular complexes, left axis deviation, LVH. Glucose 146, BUN 107, creatinine 3.6, sodium greater than 160, potassium 5.6, chloride 128, CO2 27, calcium 9.7, alkaline phosphatase 158, ALT 39, AST 51, total protein 8.9. White blood cell count 9.2, H and H 19.4 and 61.3, platelet count 114. Troponin borderline at 0.28. CT scan of the head showed progressive changes of right MCA territory infarct, stable encephalomalacia involving the left frontal lobe and left parietal lobe. Chest x-ray n acute cardiopulmonary disease. Next troponin 0.28. Next sodium greater than 160, creatinine 3.58. X-ray showed nasogastric tube in the fundus of the stomach. Next few sodiums still high. Last troponin borderline at 0.28. Urinalysis 2+ blood. On the 27th, creatinine came down to 2.92, sodium 157. On the 28th, creatinine came down to 2.5, sodium  153. On the 30th, creatinine 2.09, sodium 152.   HOSPITAL COURSE PER PROBLEM LIST:  1.  For the patient's acute encephalopathy, this had improved. I think this was all secondary to acute renal failure, severe dehydration and severe hypernatremia. This improved with IV fluid hydration.  2.  For the patient's acute renal failure, severe hypernatremia, severe dehydration. The patient was given D5W the entire hospital course. Creatinine improved and sodium did improve.  3.  History of CVA with aphasia, dysphasia and left-sided weakness. The patient did not do well with a speech eval, was put on a thickened liquid diet but kept on pushing the food away.  4.  Hypertension. Blood pressure was on the lower side during the hospital stay. No need for meds.  5.  Hyperlipidemia. Held patient's statin during the entire hospital course.  6.  Failure to thrive. The patient did not do well with speech therapy and his diet. He is high risk for repeat acute renal failure, severe hypernatremia and severe dehydration. This is a failure to thrive. Family was approached about the hospice home since they did not want to do a PEG placement. Overall prognosis is poor. The patient will not be able to keep up with his nutritional needs. The patient is a DO NOT RESUSCITATE and transferred to the hospice home on 06/08/2013.   TIME SPENT ON DISCHARGE: 35 minutes.   ____________________________ Herschell Dimesichard J. Renae GlossWieting, MD rjw:cs D: 06/08/2013 15:07:12 ET T: 06/08/2013 15:43:22 ET JOB#: [id number]  cc: Herschell Dimesichard J. Renae GlossWieting, MD, <Dictator> Serita ShellerErnest B. Maryellen PileEason, MD Hospice Home  Salley ScarletICHARD J Sahith Nurse MD ELECTRONICALLY SIGNED  06/09/2013 16:31 

## 2014-09-30 NOTE — H&P (Signed)
PATIENT NAME:  Jacob Petty MR#:  [id number] DATE OF BIRTH:  [birth year]  DATE OF ADMISSION:  06/03/2013  PRIMARY CARE PHYSICIAN: Dr. Maryellen PileEason.    REFERRING PHYSICIAN: Dr. Carollee MassedKaminski.   HISTORY OF PRESENT ILLNESS:  Jacob Petty is a 79 year old African-American male with history of CVA bilateral, who is a resident of [facility], is brought to the Emergency Department for altered mental status. Could not obtain any history from the patient.  No records have been sent from the nursing home about the patient's condition in the last few days. However, per the ER staff, the patient was found to have periods of apnea with oxygen saturations in the 60s, more confused than his baseline. EMS reported no periods of apnea and was satting in 90s. In the Emergency Department, the patient was found to have a sodium of 160, elevated BUN and creatinine of 107 and 3.60. The patient, at baseline, has BUN and creatinine of 21 and 1.50. This is a significant worsening from the baseline. The patient is also found to be hyperkalemic with a potassium of 5.6.  Unable to obtain any history from the patient. No obvious signs of infection are found. Chest x-ray did not show any obvious infiltrate. CT head without contrast showed worsening of the infarction on the right side.   PAST MEDICAL HISTORY:  1.  Multiple strokes.  2.  Congestive heart failure with EF of 22 to 25% per previous records.  3.  Hypertension.  4.  Hyperlipidemia.  5.  Coronary artery disease.  6.  Aphasia from the previous strokes.  7.  Chronic renal insufficiency.   PAST SURGICAL HISTORY: Unable to obtain from the patient.   SOCIAL HISTORY: [facility] lives [facility].   ALLERGIES: No known drug allergies.   HOME MEDICATIONS: 1.  Senna 8.6 mg 2 tablets once a day.  2.  Coreg 3.125 mg 2 times a day.  3.  Atorvastatin 80 mg once a day.  4.  Aspirin 325 mg once a day.   SOCIAL HISTORY: Could not be obtained from the  patient.   FAMILY HISTORY: Could not be obtained from the patient secondary to altered mental status.   REVIEW OF SYSTEMS: Could not be obtained from the patient secondary to altered mental status.   PHYSICAL EXAMINATION: GENERAL: This is a thin, chronically ill-looking male, lying down in the bed, not in distress.  VITAL SIGNS: Temperature 97.5, pulse 70, blood pressure 108/83, respiratory rate of 16, oxygen saturation is 98% on room air.  HEENT: Head normocephalic, atraumatic. There is no scleral icterus. Conjunctivae normal. Pupils are equal and react to light. Bitemporal wasting, shrunken eyes.  Mucous membranes dry.  NECK: Supple. No lymphadenopathy. No JVD. No carotid bruit. No thyromegaly.  CHEST: Has no focal tenderness.  LUNGS: Bilaterally clear to auscultation.  HEART: S1, S2, regular. No murmurs are heard. No pedal edema. Pulses 2+.  ABDOMEN: Bowel sounds present. Soft, nontender, nondistended. No hepatosplenomegaly.  SKIN: No rash or lesions.  MUSCULOSKELETAL: Could not examine as the patient is in altered mental status.  NEUROLOGIC: The patient is not oriented to place, person and time. Could not examine the cranial nerves or motor function, however, moving all four extremities. I could not examine the sensory.   LABORATORY DATA: CMP: Sodium 160, BUN 170, creatinine of 3.60, potassium of 5.6, chloride 128, bicarbonate 27.   CBC: WBC of 5.4, hemoglobin 19.4, platelet count of 114. Coag profile is completely within normal limits.  CT HEAD WITHOUT CONTRAST: Progress of changes of the right MCA territory infarct is stable encephalomalacia involving the left frontal and left parietal lobe.   CHEST X-RAY, ONE VIEW PORTABLE:  No acute cardiopulmonary disease.   ASSESSMENT AND PLAN: [name] is a 79 year old male who is from [facility] who comes to the Emergency Department with altered mental status, most likely secondary to severe dehydration.  1.  Hyponatremia. The  patient has sodium of 160, most likely this is secondary to lack of p.o. intake. Keep NG tube. Continue the free water boluses. Follow up with the BMP q.6 hours. 2.  Acute renal failure. The patient has significant elevation of the BUN and creatinine, ratio greater than 20. This is secondary to prerenal. Continue with IV fluids and follow up.  3.   Hyperkalemia, again this could be secondary to dehydration. We will give him intravenous fluids and follow up. If the patient does not have any EKG changes consistent with hyperkalemia. Will keep Kayexalate through NG tube.  4.  Altered mental status. This is a combination of  hypernatremia, possible underlying stroke.  Considering the patient's multiple strokes, seizures cannot be excluded, especially concerning about the patient's periods of apnea.  5.  Congestive heart failure with ejection fraction of 20 to 25%. Will  have a close watch on the patient.  6.  Mild elevation of the troponin. The patient has chronically elevated troponin of 0.15. The patient today has troponin of 0.28, most likely, this is secondary to renal insufficiency. We will cycle further cardiac enzymes x 3. However, this seems to be from myocardial infarction.  7.  History of cerebrovascular accident. We will keep the patient on aspirin 325 mg through NG tube.  8.  Hypertension: Will hold all blood pressure medications, as the patient has had low normal blood pressure secondary to dehydration.  9.  Keep the patient on deep vein thrombosis prophylaxis with heparin.   TIME SPENT: 55 minutes.     ____________________________ Susa Griffins, MD pv:NTS D: 06/04/2013 00:27:34 ET T: 06/04/2013 02:12:14 ET JOB#: [id number]  cc: Susa Griffins, MD, <Dictator> Serita Sheller. Maryellen Pile, MD Clerance Lav Jackelin Correia MD ELECTRONICALLY SIGNED 06/17/2013 21:20
